# Patient Record
Sex: Male | Born: 1995 | Race: White | Hispanic: No | Marital: Single | Smoking: Never smoker
Health system: Southern US, Community
[De-identification: ages and names within clinical notes are randomized; demographics above are authoritative.]

## PROBLEM LIST (undated history)

## (undated) HISTORY — PX: WISDOM TOOTH EXTRACTION: SHX21

---
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
## 2016-04-03 IMAGING — DX DG CLAVICLE*R*
2 series · 2 of 2 positions shown · non-contrast
Comparison: None.

CLINICAL DATA: Lacrosse injury to the right shoulder. Initial
encounter.

EXAM:
RIGHT CLAVICLE - 2+ VIEWS

[clavicle ap]
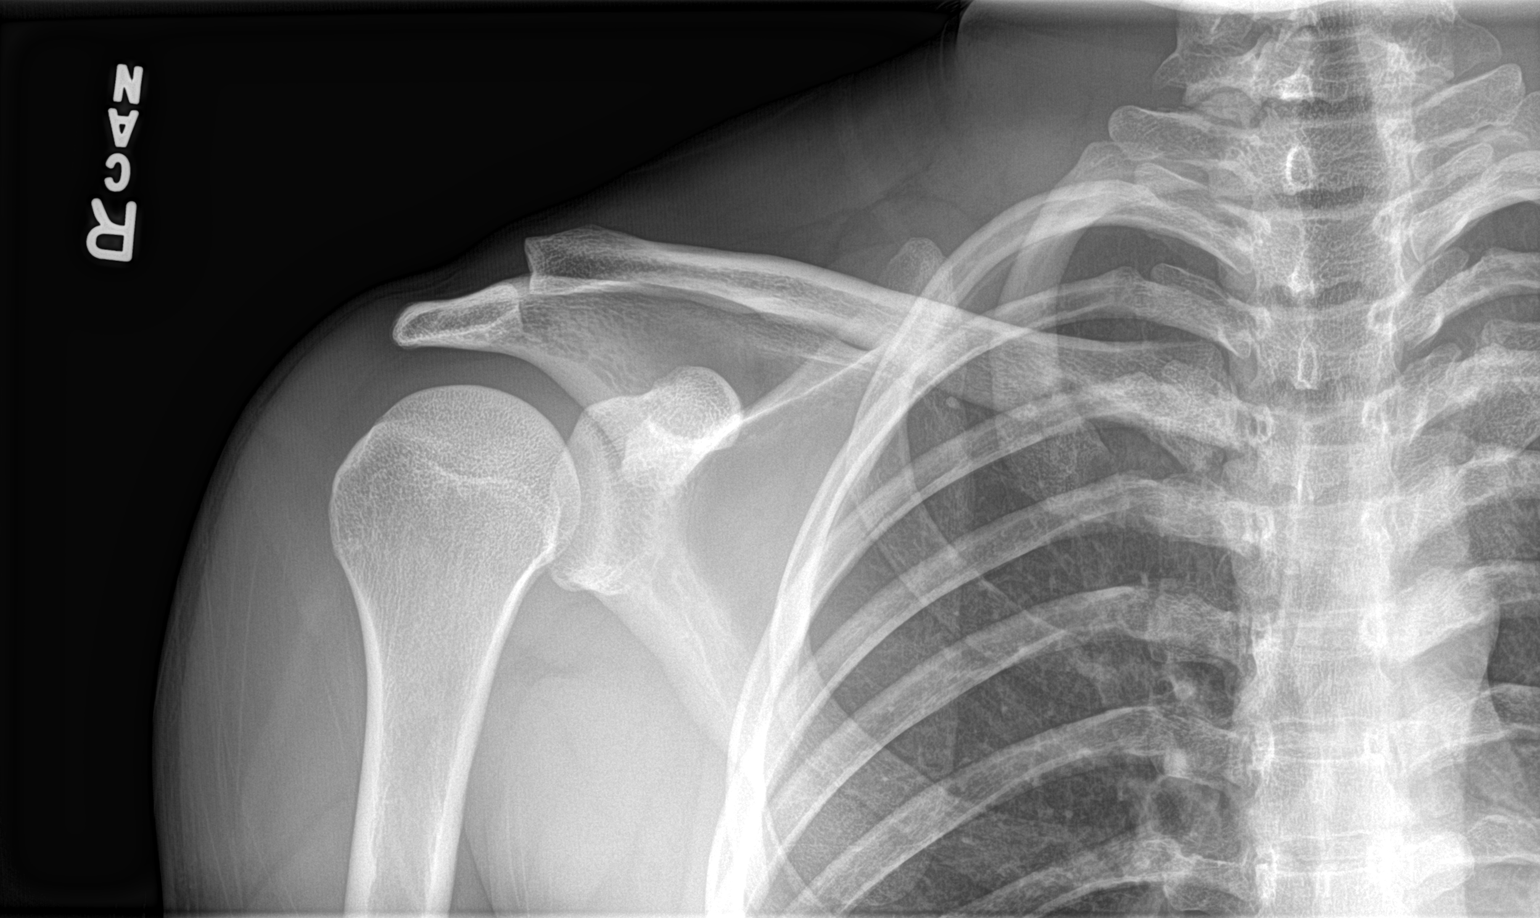

[clavicle axial]
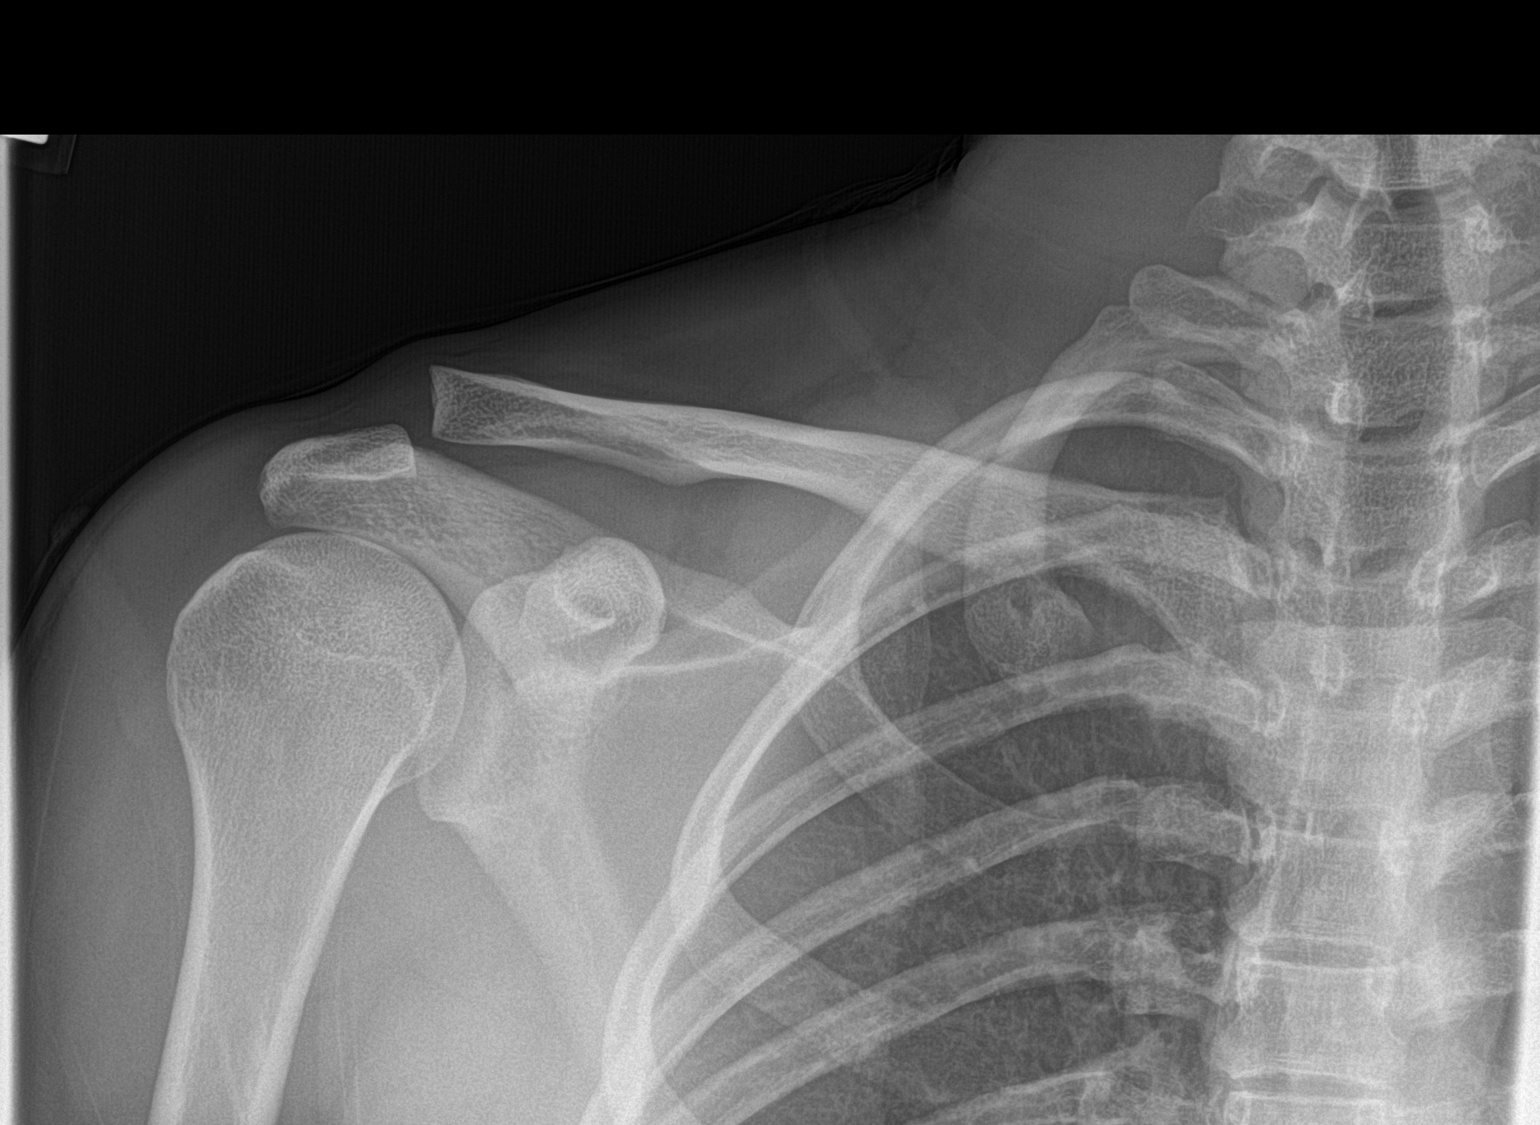

[2 of 2 positions shown; findings below may reference images not displayed]

FINDINGS: Near complete dissociation of the acromioclavicular joint with
inferior displacement of the scapula. The coracoclavicular interval
is mildly widened at 14 mm. No acute fracture.
IMPRESSION: AC joint separation. Borderline wide coracoclavicular interval,
suspect coracoclavicular ligament injury.

## 2016-04-20 IMAGING — RF DG FLUORO GUIDE NDL PLC/BX
1 series · 2 of 2 positions shown · non-contrast
Comparison: none

CLINICAL DATA: Lacrosse injury with shoulder pain, clinically AC
joint separation. Evaluate for labral tear. Injury for 2 weeks ago.
Initial encounter.

[Series 9999: dg fluoro guide ndl plc/bx · 2 of 2 slices shown]
[im 1/2]
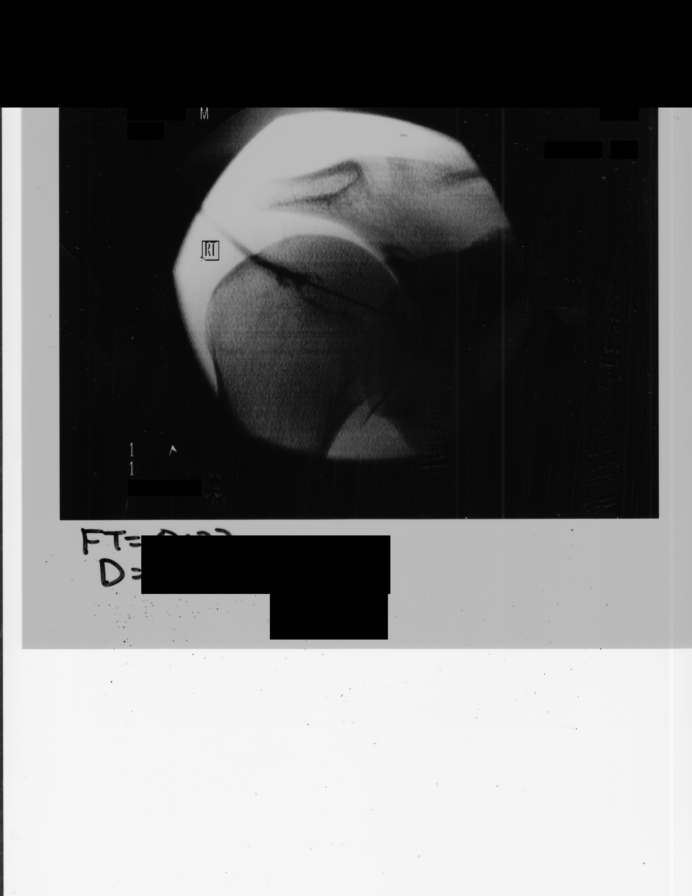
[im 2/2]
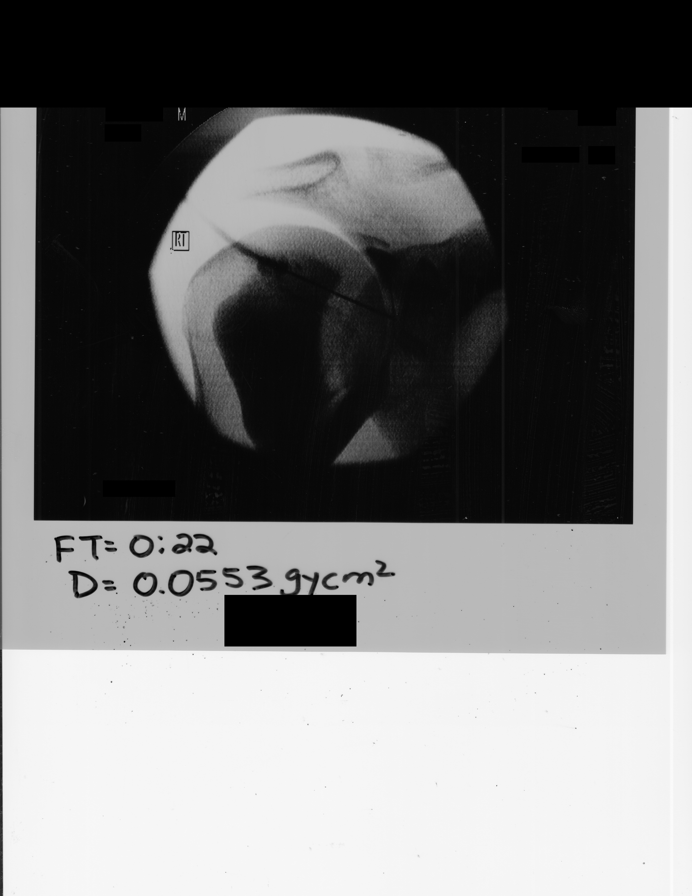

[2 of 2 positions shown; findings below may reference images not displayed]

FLUOROSCOPY TIME:  22 seconds corresponding to a dose of 0.05 Gy cm
squared

PROCEDURE:
RIGHT SHOULDER INJECTION UNDER FLUOROSCOPY IN PREPARATION FOR MR
ARTHROGRAM

INFORMED WRITTEN CONSENT WAS OBTAINED.  TIME-OUT WAS PERFORMED.

An appropriate skin entrance site was determined. The site was
marked, prepped with Betadine, draped in the usual sterile fashion,
and infiltrated locally with 1% Lidocaine. 22 gauge spinal needle
was advanced to the superomedial margin of the humeral head under
intermittent fluoroscopy. 1 ml of Lidocaine injected easily. A
mixture of 0.1 ml Multihance and 20 ml of dilute Omnipaque 180 was
then used to opacify the RIGHT shoulder capsule. Total volume of
injectate 15 mL. No immediate complication.
IMPRESSION: Technically successful RIGHT shoulder injection for MRI.

## 2016-04-20 IMAGING — MR MR SHOULDER*R* W/CM
4 of 6 series · 17 of 40 positions shown · IV contrast (agent unspecified)
Comparison: None.

CLINICAL DATA: RIGHT SHOULDER PAIN AFTER A LACROSSE INJURY.

EXAM:
MR ARTHROGRAM OF THE RIGHT SHOULDER
TECHNIQUE: Multiplanar, multisequence MR imaging of the RIGHT shoulder was
performed following the administration of intra-articular contrast.
CONTRAST:  See Injection Documentation.

[Series 2: T1 fat-sat · axial · 4.0mm · 0.22mm/px · z∈[-39,+42]mm · 5 of 22 slices shown (1 of 2)]
[im 1/22]
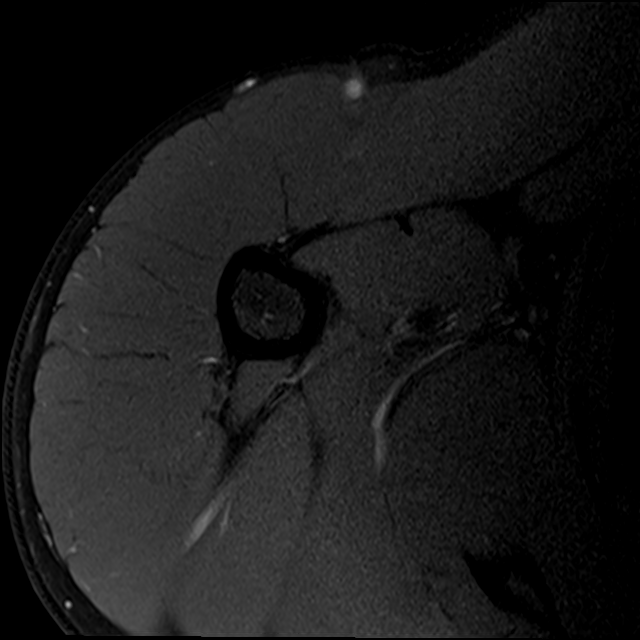
[im 4/22]
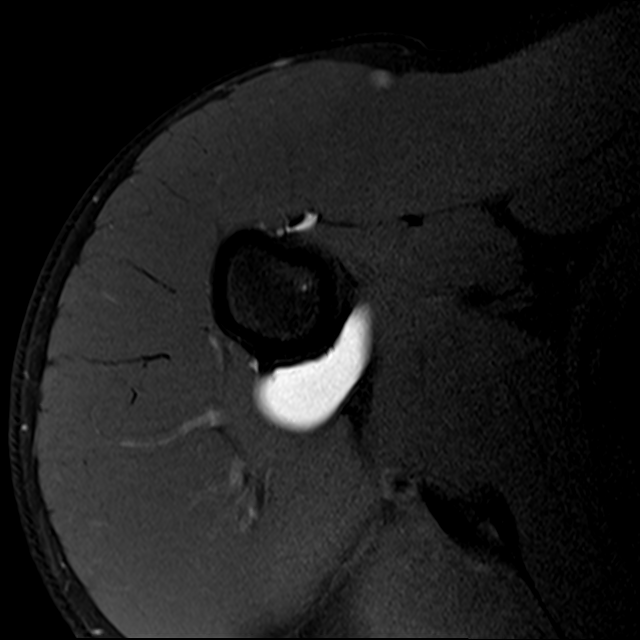
[im 7/22]
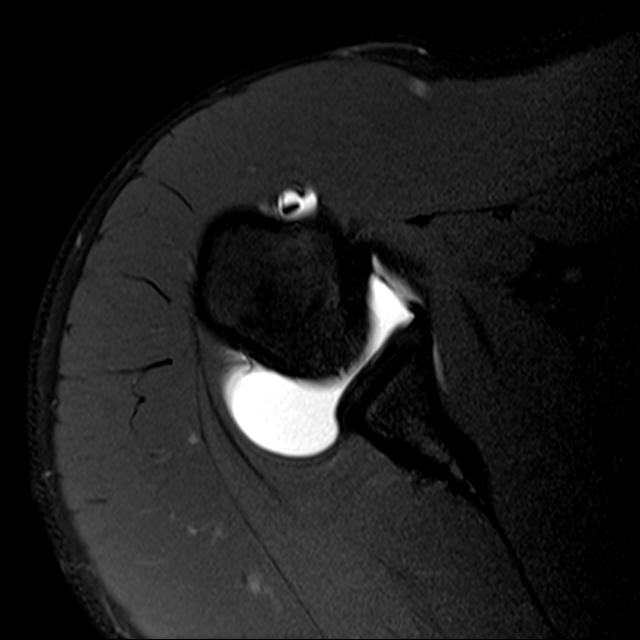
[im 13/22]
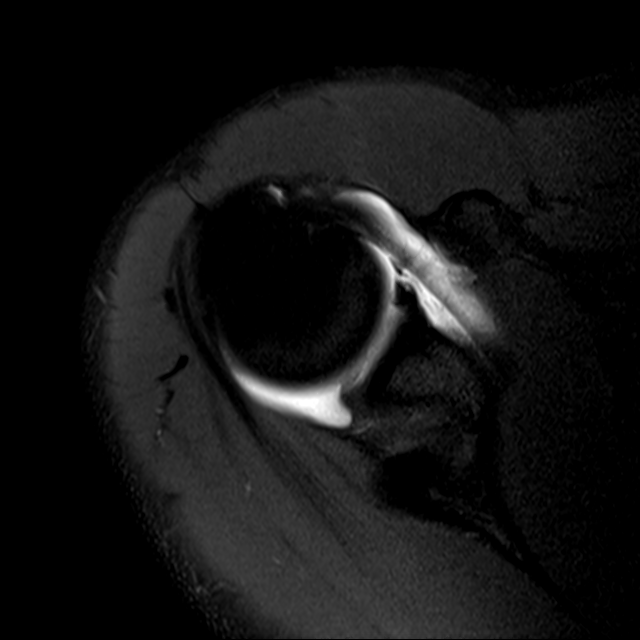
[im 19/22]
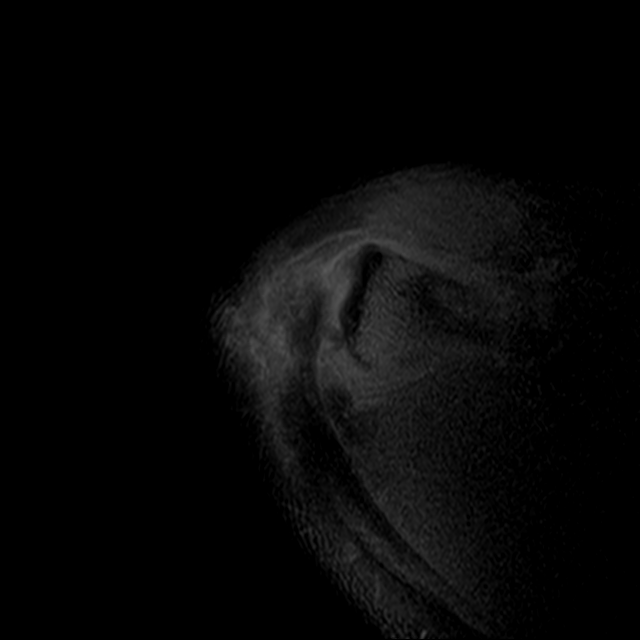

[Series 3: T1 fat-sat · sagittal · 4.0mm · 0.22mm/px · 3 of 17 slices shown (2 of 2)]
[im 3/17]
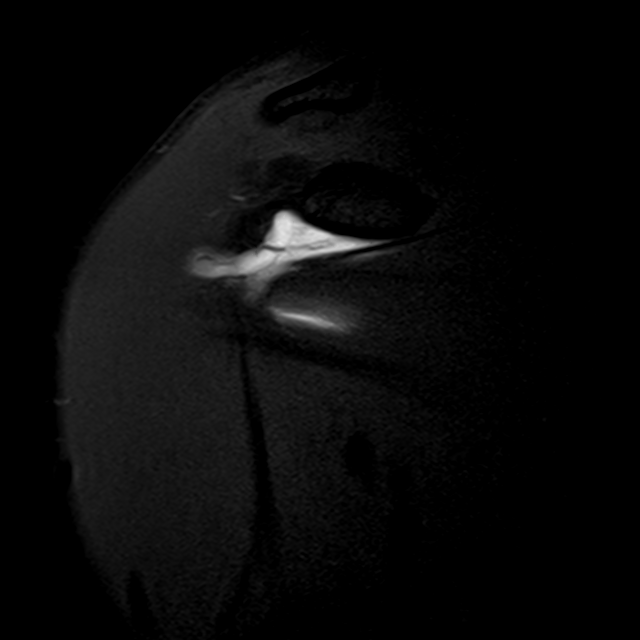
[im 9/17]
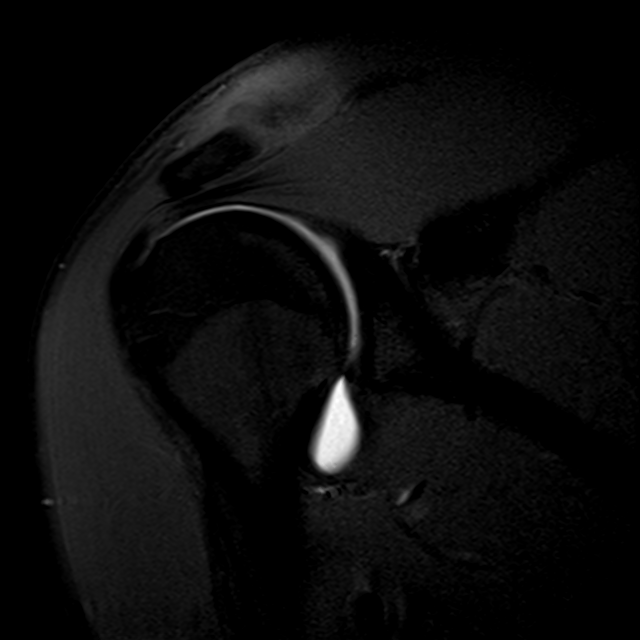
[im 14/17]
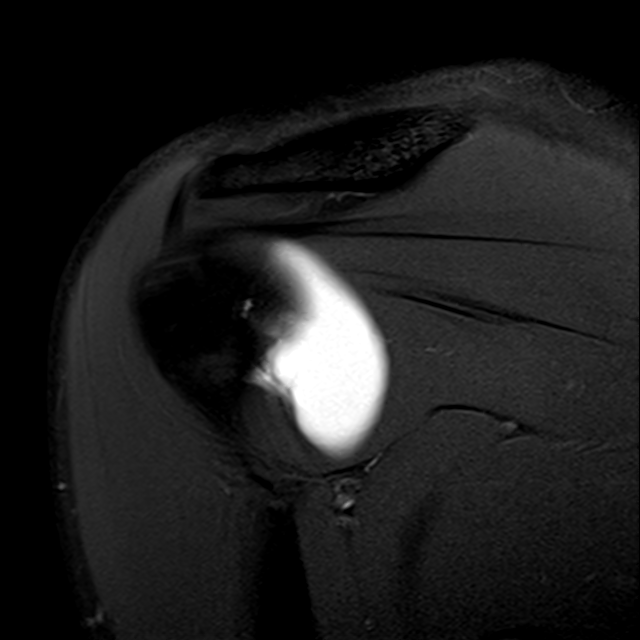

[Series 4: T1 · sagittal · 4.0mm · 0.22mm/px · 3 of 17 slices shown]
[im 3/17]
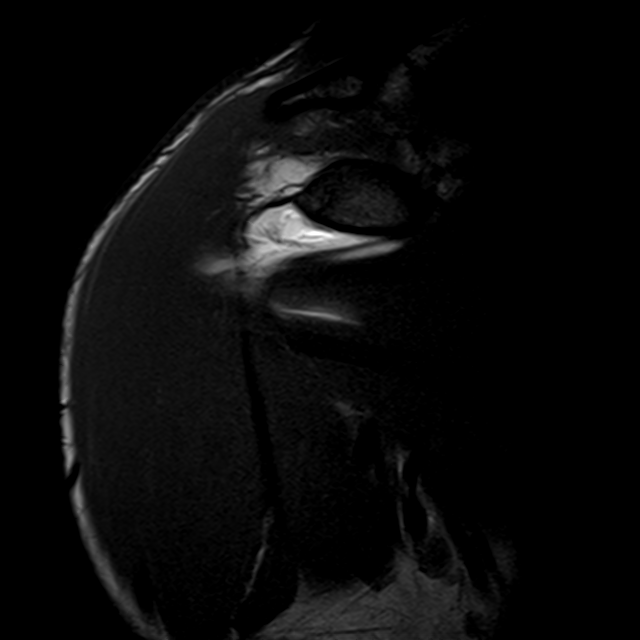
[im 9/17]
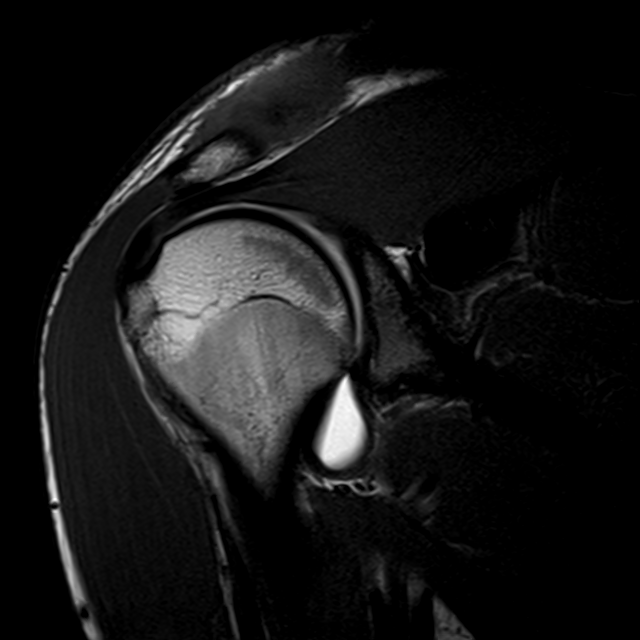
[im 14/17]
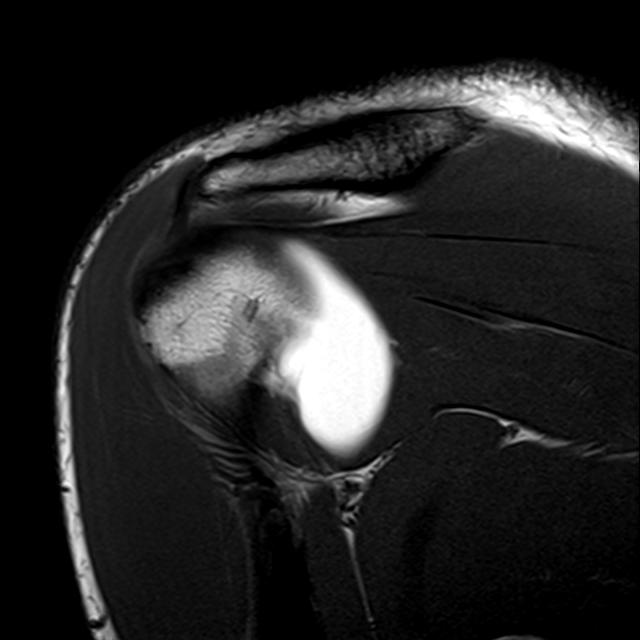

[Series 5: T2 fat-sat · sagittal · 4.0mm · 0.44mm/px · 6 of 16 slices shown]
[im 1/16]
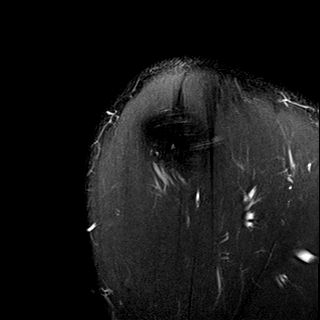
[im 4/16]
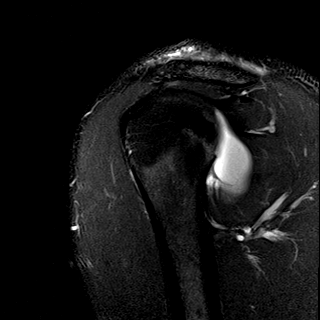
[im 7/16]
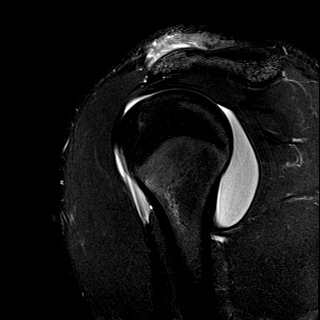
[im 10/16]
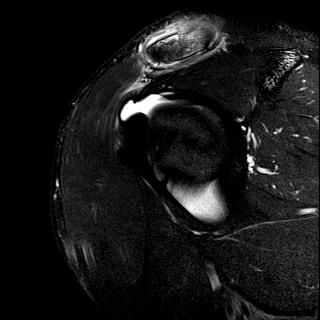
[im 13/16]
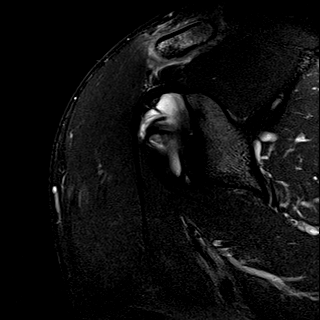
[im 16/16]
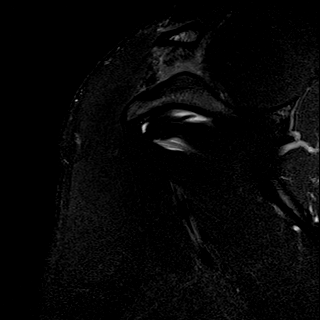

[17 of 40 positions shown; findings below may reference images not displayed]

FINDINGS: Rotator cuff: Supraspinatus tendon is intact. Infraspinatus tendon
is intact. Teres minor tendon is intact. Subscapularis tendon is
intact.

Muscles: No atrophy or fatty replacement of nor abnormal signal
within, the muscles of the rotator cuff.

Biceps long head: Intact.

Acromioclavicular Joint: There is severe marrow edema in the distal
clavicle and soft tissue edema surrounding the distal clavicle.
There is fluid in the acromioclavicular joint. There is disruption
of the superior AC joint capsule with slight elevation of the distal
clavicle relative to the acromion. There is a tear of the
coracoclavicular ligament. The coracoacromial ligament is intact.
Type 1 acromion.

Glenohumeral Joint: Normal glenohumeral joint. Intraarticular
contrast. No chondral defect.

Labrum: Intact.

Bones:
IMPRESSION: 1. Severe marrow edema in the distal clavicle and soft tissue edema
surrounding the distal clavicle with fluid in the acromioclavicular
joint. Disruption of the superior AC joint capsule with slight
elevation of the distal clavicle relative to the acromion. Tear of
the coracoclavicular ligament.
